# Patient Record
Sex: Male | Born: 1980 | Race: White | Hispanic: No | Marital: Single | Smoking: Current every day smoker
Health system: Southern US, Community
[De-identification: ages and names within clinical notes are randomized; demographics above are authoritative.]

## PROBLEM LIST (undated history)

## (undated) DIAGNOSIS — J45909 Unspecified asthma, uncomplicated: Secondary | ICD-10-CM

## (undated) HISTORY — PX: OTHER SURGICAL HISTORY: SHX169

## (undated) HISTORY — PX: ANKLE FRACTURE SURGERY: SHX122

---
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
## 2004-10-01 ENCOUNTER — Emergency Department (HOSPITAL_COMMUNITY): Admission: EM | Admit: 2004-10-01 | Discharge: 2004-10-01 | Payer: Self-pay | Admitting: Emergency Medicine

## 2015-10-10 ENCOUNTER — Encounter (HOSPITAL_COMMUNITY): Payer: Self-pay | Admitting: *Deleted

## 2015-10-10 ENCOUNTER — Emergency Department (HOSPITAL_COMMUNITY)
Admission: EM | Admit: 2015-10-10 | Discharge: 2015-10-11 | Disposition: A | Discharge status: Home / Self Care | Payer: Self-pay | Attending: Emergency Medicine | Admitting: Emergency Medicine

## 2015-10-10 ENCOUNTER — Emergency Department (HOSPITAL_COMMUNITY): Payer: Self-pay

## 2015-10-10 DIAGNOSIS — Y92410 Unspecified street and highway as the place of occurrence of the external cause: Secondary | ICD-10-CM | POA: Insufficient documentation

## 2015-10-10 DIAGNOSIS — Y998 Other external cause status: Secondary | ICD-10-CM | POA: Insufficient documentation

## 2015-10-10 DIAGNOSIS — J45909 Unspecified asthma, uncomplicated: Secondary | ICD-10-CM | POA: Insufficient documentation

## 2015-10-10 DIAGNOSIS — S39012A Strain of muscle, fascia and tendon of lower back, initial encounter: Secondary | ICD-10-CM | POA: Insufficient documentation

## 2015-10-10 DIAGNOSIS — Z7951 Long term (current) use of inhaled steroids: Secondary | ICD-10-CM | POA: Insufficient documentation

## 2015-10-10 DIAGNOSIS — S161XXA Strain of muscle, fascia and tendon at neck level, initial encounter: Secondary | ICD-10-CM | POA: Insufficient documentation

## 2015-10-10 DIAGNOSIS — Z720 Tobacco use: Secondary | ICD-10-CM | POA: Insufficient documentation

## 2015-10-10 DIAGNOSIS — Z79899 Other long term (current) drug therapy: Secondary | ICD-10-CM | POA: Insufficient documentation

## 2015-10-10 DIAGNOSIS — S93401A Sprain of unspecified ligament of right ankle, initial encounter: Secondary | ICD-10-CM | POA: Insufficient documentation

## 2015-10-10 DIAGNOSIS — Y9389 Activity, other specified: Secondary | ICD-10-CM | POA: Insufficient documentation

## 2015-10-10 HISTORY — DX: Unspecified asthma, uncomplicated: J45.909
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
## 2015-10-10 NOTE — ED Notes (Signed)
Patient transported to CT 
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
## 2015-10-11 MED ORDER — HYDROCODONE-ACETAMINOPHEN 5-325 MG PO TABS
1.0000 | ORAL_TABLET | Freq: Once | ORAL | Status: AC
  Administered 2015-10-11: 1 via ORAL
  Filled 2015-10-11: qty 1

## 2015-10-11 MED ORDER — HYDROCODONE-ACETAMINOPHEN 5-325 MG PO TABS: 1.0000 | ORAL_TABLET | Freq: Four times a day (QID) | ORAL | Status: AC | PRN

## 2015-10-11 MED ORDER — IBUPROFEN 800 MG PO TABS: 800.0000 mg | ORAL_TABLET | Freq: Three times a day (TID) | ORAL | Status: AC | PRN
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
## 2015-10-11 NOTE — Discharge Instructions (Signed)
Return  Here as needed. Follow up with your doctor. Ice and elevate the ankle.
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
## 2017-12-17 DEATH — deceased
